# Patient Record
Sex: Male | Born: 2005 | Race: Black or African American | Hispanic: No | Marital: Single | Smoking: Never smoker
Health system: Southern US, Community
[De-identification: ages and names within clinical notes are randomized; demographics above are authoritative.]

## PROBLEM LIST (undated history)

## (undated) DIAGNOSIS — J45909 Unspecified asthma, uncomplicated: Secondary | ICD-10-CM

## (undated) HISTORY — PX: TONSILLECTOMY: SUR1361

---
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
## 2024-03-16 ENCOUNTER — Emergency Department (HOSPITAL_COMMUNITY)
Admission: EM | Admit: 2024-03-16 | Discharge: 2024-03-16 | Disposition: A | Discharge status: Home / Self Care | Attending: Emergency Medicine | Admitting: Emergency Medicine

## 2024-03-16 ENCOUNTER — Emergency Department (HOSPITAL_COMMUNITY)
Admission: EM | Admit: 2024-03-16 | Discharge: 2024-03-17 | Disposition: A | Discharge status: Home / Self Care | Source: Home / Self Care | Attending: Emergency Medicine | Admitting: Emergency Medicine

## 2024-03-16 ENCOUNTER — Other Ambulatory Visit: Payer: Self-pay

## 2024-03-16 ENCOUNTER — Encounter (HOSPITAL_COMMUNITY): Payer: Self-pay

## 2024-03-16 ENCOUNTER — Encounter (HOSPITAL_COMMUNITY): Payer: Self-pay | Admitting: Emergency Medicine

## 2024-03-16 DIAGNOSIS — Y92410 Unspecified street and highway as the place of occurrence of the external cause: Secondary | ICD-10-CM | POA: Diagnosis not present

## 2024-03-16 DIAGNOSIS — R369 Urethral discharge, unspecified: Secondary | ICD-10-CM | POA: Insufficient documentation

## 2024-03-16 DIAGNOSIS — M549 Dorsalgia, unspecified: Secondary | ICD-10-CM | POA: Diagnosis present

## 2024-03-16 DIAGNOSIS — S39012A Strain of muscle, fascia and tendon of lower back, initial encounter: Secondary | ICD-10-CM | POA: Diagnosis not present

## 2024-03-16 MED ORDER — LIDOCAINE 5 % EX PTCH
1.0000 | MEDICATED_PATCH | CUTANEOUS | Status: DC
  Administered 2024-03-16: 1 via TRANSDERMAL
  Filled 2024-03-16: qty 1

## 2024-03-16 MED ORDER — LIDOCAINE 5 % EX PTCH: 1.0000 | MEDICATED_PATCH | CUTANEOUS | 0 refills | Status: AC

## 2024-03-16 MED ORDER — CYCLOBENZAPRINE HCL 10 MG PO TABS: 10.0000 mg | ORAL_TABLET | Freq: Two times a day (BID) | ORAL | 0 refills | Status: AC | PRN

## 2024-03-16 MED ORDER — KETOROLAC TROMETHAMINE 15 MG/ML IJ SOLN
15.0000 mg | Freq: Once | INTRAMUSCULAR | Status: AC
  Administered 2024-03-16: 15 mg via INTRAMUSCULAR
  Filled 2024-03-16: qty 1
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
## 2024-03-16 NOTE — ED Triage Notes (Signed)
 Pt complaining of having a white discharge. Denies burning but "something doesn't feel right." Thinks it may chlamydia

## 2024-03-17 LAB — GC/CHLAMYDIA PROBE AMP (~~LOC~~) NOT AT ARMC
Chlamydia: POSITIVE — AB
Comment: NEGATIVE
Comment: NORMAL
Neisseria Gonorrhea: NEGATIVE

## 2024-03-17 MED ORDER — CEFTRIAXONE SODIUM 500 MG IJ SOLR
500.0000 mg | Freq: Once | INTRAMUSCULAR | Status: AC
  Administered 2024-03-17: 500 mg via INTRAMUSCULAR
  Filled 2024-03-17: qty 500

## 2024-03-17 MED ORDER — DOXYCYCLINE HYCLATE 100 MG PO CAPS: 100.0000 mg | ORAL_CAPSULE | Freq: Two times a day (BID) | ORAL | 0 refills | Status: AC

## 2024-03-17 NOTE — Discharge Instructions (Addendum)
 Take antibiotics as prescribed and complete the full course.  Follow up in your MyChart account for your test results. If your results are positive you will need to notify all partners for treatment. No sex for 10 days after all partners have been treated to prevent reinfection.   GO to the health department as needed for free testing.

## 2024-03-17 NOTE — ED Provider Notes (Signed)
 [place] EMERGENCY DEPARTMENT AT [facility] Provider Note   CSN: [id number] Arrival date & time: 03/16/24  2116     History  Chief Complaint  Patient presents with   possible std    Patrick Liu is a 18 y.o. male.  18 year old male presents emergency room with concern for white urethral discharge for the past 2 days, is sexually active.  Similar to prior chlamydia infection 3 years ago.  Denies sores or other complaints or concerns today.       Home Medications Prior to Admission medications   Medication Sig Start Date End Date Taking? Authorizing Provider  doxycycline (VIBRAMYCIN) 100 MG capsule Take 1 capsule (100 mg total) by mouth 2 (two) times daily for 7 days. 03/17/24 03/24/24 Yes Darlis Eisenmenger, PA-C  acetaminophen  (TYLENOL ) 160 MG/5ML liquid Take 17.9 mLs (572.8 mg total) by mouth every 6 (six) hours as needed for pain. 03/18/17   Jannine Meo, NP  cyclobenzaprine (FLEXERIL) 10 MG tablet Take 1 tablet (10 mg total) by mouth 2 (two) times daily as needed for muscle spasms. 03/16/24   Denese Finn, PA-C  famotidine  (PEPCID ) 20 MG tablet Take 1 tablet (20 mg total) by mouth daily for 7 days. 08/26/20 09/02/20  Kala Orchard, MD  guaiFENesin  (ROBITUSSIN) 100 MG/5ML liquid Take 5-10 mLs (100-200 mg total) by mouth every 4 (four) hours as needed for cough. 01/20/19   Landa Pine, FNP  ibuprofen  (CHILDRENS MOTRIN ) 100 MG/5ML suspension Take 20 mLs (400 mg total) by mouth every 6 (six) hours as needed for mild pain or moderate pain. 01/20/19   Jerri Morale A, FNP  lidocaine  (LIDODERM ) 5 % Place 1 patch onto the skin daily. Remove & Discard patch within 12 hours or as directed by MD 03/16/24   Denese Finn, PA-C      Allergies    Patient has no known allergies.    Review of Systems   Review of Systems Negative except as per HPI Physical Exam Updated Vital Signs BP (!) 124/92 (BP Location: Right Arm)   Pulse 66   Temp 98.1 F (36.7 C) (Oral)    Resp 18   Ht 5\' 10"  (1.778 m)   Wt 65.8 kg   SpO2 99%   BMI 20.81 kg/m  Physical Exam Vitals and nursing note reviewed. Exam conducted with a chaperone present.  Constitutional:      General: He is not in acute distress.    Appearance: He is well-developed. He is not diaphoretic.  HENT:     Head: Normocephalic and atraumatic.  Pulmonary:     Effort: Pulmonary effort is normal.  Genitourinary:    Penis: Normal and circumcised. No discharge.   Skin:    General: Skin is warm and dry.  Neurological:     Mental Status: He is alert and oriented to person, place, and time.  Psychiatric:        Behavior: Behavior normal.     ED Results / Procedures / Treatments   Labs (all labs ordered are listed, but only abnormal results are displayed) Labs Reviewed  GC/CHLAMYDIA PROBE AMP ([place]) NOT AT [facility]    EKG None  Radiology No results found.  Procedures Procedures    Medications Ordered in ED Medications  cefTRIAXone (ROCEPHIN) injection 500 mg (has no administration in time range)    ED Course/ Medical Decision Making/ A&P  Medical Decision Making  18 year old male with complaint of white urethral discharge x 2 days, sexually active, concern for chlamydia as feels similar to when he had this infection3 years ago.  Chaperone present for exam, no discharge present at this time, exam unremarkable.  Patient is provided with Rocephin and prescription for doxycycline.  Encouraged to take full course of antibiotics, abstain from intercourse, follow-up in his MyChart for his test results and notify partners if positive.        Final Clinical Impression(s) / ED Diagnoses Final diagnoses:  Urethral discharge in male    Rx / DC Orders ED Discharge Orders          Ordered    doxycycline (VIBRAMYCIN) 100 MG capsule  2 times daily        03/17/24 0040              Darlis Eisenmenger, PA-C 03/17/24 0050    Alissa April,  MD 03/17/24 [phone]

## 2024-11-23 ENCOUNTER — Encounter (HOSPITAL_COMMUNITY): Payer: Self-pay | Admitting: *Deleted

## 2024-11-23 ENCOUNTER — Ambulatory Visit (HOSPITAL_COMMUNITY)
Admission: EM | Admit: 2024-11-23 | Discharge: 2024-11-23 | Disposition: A | Discharge status: Home / Self Care | Attending: Emergency Medicine | Admitting: Emergency Medicine

## 2024-11-23 DIAGNOSIS — R369 Urethral discharge, unspecified: Secondary | ICD-10-CM | POA: Insufficient documentation

## 2024-11-23 DIAGNOSIS — R21 Rash and other nonspecific skin eruption: Secondary | ICD-10-CM | POA: Diagnosis not present

## 2024-11-23 LAB — HIV ANTIBODY (ROUTINE TESTING W REFLEX): HIV Screen 4th Generation wRfx: NONREACTIVE

## 2024-11-23 MED ORDER — MUPIROCIN 2 % EX OINT: 1.0000 | TOPICAL_OINTMENT | Freq: Two times a day (BID) | CUTANEOUS | 0 refills | Status: AC

## 2024-11-23 MED ORDER — CEFTRIAXONE SODIUM 500 MG IJ SOLR
500.0000 mg | INTRAMUSCULAR | Status: DC
  Administered 2024-11-23: 500 mg via INTRAMUSCULAR

## 2024-11-23 MED ORDER — LIDOCAINE HCL (PF) 1 % IJ SOLN
INTRAMUSCULAR | Status: AC
  Filled 2024-11-23: qty 2

## 2024-11-23 MED ORDER — CEFTRIAXONE SODIUM 500 MG IJ SOLR
INTRAMUSCULAR | Status: AC
  Filled 2024-11-23: qty 500

## 2024-11-23 MED ORDER — DOXYCYCLINE HYCLATE 100 MG PO TABS: 100.0000 mg | ORAL_TABLET | Freq: Two times a day (BID) | ORAL | 0 refills | Status: AC

## 2024-11-23 NOTE — ED Triage Notes (Signed)
 Pt states he has a rash on the back of his left leg X 3 days. He had been putting alcohol and peroxide on it.   Pt states he has discharge from his penis x 1 month. He states he was tested in jail but did not get results. He would like STI testing cyto and blood work

## 2024-11-23 NOTE — Discharge Instructions (Signed)
 We have given you an injection in clinic that we will treat gonorrhea.  Take the doxycycline  twice daily with food to help treat any potential chlamydia.  Use mupirocin  ointment twice daily to the bug bite site after cleaning with warm water and antibacterial solutions such as Dial soap or Hibiclens.  Abstain from intercourse until all results from STI screening of been received.  Notify any sexual partners that they can also receive treatment.  Return to clinic for new or urgent symptoms.

## 2024-11-23 NOTE — ED Provider Notes (Signed)
 " [facility]    CSN: [id number] Arrival date & time: 11/23/24  1500      History   Chief Complaint Chief Complaint  Patient presents with   Rash   SEXUALLY TRANSMITTED DISEASE    HPI Patrick Liu is a 19 y.o. male.   Presents to clinic with grandmother over concern of a potential bug bite to the left inner thigh as well as penile discharge   Was out playing basketball around 3 days ago and felt like something bit him on the left calf, was wearing shorts  Has been putting alcohol and peroxide on this w/o improvement  His girlfriend has been attempting to pop it, reports one of the areas did start draining earlier today  Concern over penile discharge for the past month, intermittent Penile discharge that is white / yellow  Sexually active, last intercourse was yesterday  Penile discharge for the past months  Denies sores lesions to penis  Sexually active without routine condom use   The history is provided by the patient, medical records and a parent.  Rash   Past Medical History:  Diagnosis Date   Asthma     There are no active problems to display for this patient.   Past Surgical History:  Procedure Laterality Date   TONSILLECTOMY         Home Medications    Prior to Admission medications  Medication Sig Start Date End Date Taking? Authorizing Provider  doxycycline  (VIBRA -TABS) 100 MG tablet Take 1 tablet (100 mg total) by mouth 2 (two) times daily for 7 days. 11/23/24 11/30/24 Yes Uchechi Denison  N, FNP  mupirocin  ointment (BACTROBAN ) 2 % Apply 1 Application topically 2 (two) times daily. 11/23/24  Yes Branden Vine  N, FNP  acetaminophen  (TYLENOL ) 160 MG/5ML liquid Take 17.9 mLs (572.8 mg total) by mouth every 6 (six) hours as needed for pain. 03/18/17   Everlean Laymon SAILOR, NP  cyclobenzaprine  (FLEXERIL ) 10 MG tablet Take 1 tablet (10 mg total) by mouth 2 (two) times daily as needed for muscle spasms. 03/16/24   Victor Lynwood DASEN, PA-C   famotidine  (PEPCID ) 20 MG tablet Take 1 tablet (20 mg total) by mouth daily for 7 days. 08/26/20 09/02/20  Henriette Mora, MD  guaiFENesin  (ROBITUSSIN) 100 MG/5ML liquid Take 5-10 mLs (100-200 mg total) by mouth every 4 (four) hours as needed for cough. 01/20/19   Adah Wilbert LABOR, FNP  ibuprofen  (CHILDRENS MOTRIN ) 100 MG/5ML suspension Take 20 mLs (400 mg total) by mouth every 6 (six) hours as needed for mild pain or moderate pain. 01/20/19   Adah Wilbert A, FNP  lidocaine  (LIDODERM ) 5 % Place 1 patch onto the skin daily. Remove & Discard patch within 12 hours or as directed by MD 03/16/24   Victor Lynwood DASEN, PA-C    Family History Family History  Problem Relation Age of Onset   Healthy Mother     Social History Social History[1]   Allergies   Patient has no known allergies.   Review of Systems Review of Systems  Per HPI  Physical Exam Triage Vital Signs ED Triage Vitals  Encounter Vitals Group     BP 11/23/24 1615 129/77     Girls Systolic BP Percentile --      Girls Diastolic BP Percentile --      Boys Systolic BP Percentile --      Boys Diastolic BP Percentile --      Pulse Rate 11/23/24 1615 79     Resp  11/23/24 1615 16     Temp 11/23/24 1615 98.4 F (36.9 C)     Temp Source 11/23/24 1615 Oral     SpO2 11/23/24 1615 98 %     Weight --      Height --      Head Circumference --      Peak Flow --      Pain Score 11/23/24 1614 0     Pain Loc --      Pain Education --      Exclude from Growth Chart --    No data found.  Updated Vital Signs BP 129/77 (BP Location: Left Arm)   Pulse 79   Temp 98.4 F (36.9 C) (Oral)   Resp 16   SpO2 98%   Visual Acuity Right Eye Distance:   Left Eye Distance:   Bilateral Distance:    Right Eye Near:   Left Eye Near:    Bilateral Near:     Physical Exam Vitals and nursing note reviewed.  Constitutional:      Appearance: Normal appearance.  HENT:     Head: Normocephalic and atraumatic.     Right Ear: External ear  normal.     Left Ear: External ear normal.     Nose: Nose normal.     Mouth/Throat:     Mouth: Mucous membranes are moist.  Eyes:     Conjunctiva/sclera: Conjunctivae normal.  Cardiovascular:     Rate and Rhythm: Normal rate.  Pulmonary:     Effort: Pulmonary effort is normal. No respiratory distress.  Skin:    General: Skin is warm and dry.      Neurological:     General: No focal deficit present.     Mental Status: He is alert.  Psychiatric:        Mood and Affect: Mood normal.      UC Treatments / Results  Labs (all labs ordered are listed, but only abnormal results are displayed) Labs Reviewed  SYPHILIS: RPR W/REFLEX TO RPR TITER AND TREPONEMAL ANTIBODIES, TRADITIONAL SCREENING AND DIAGNOSIS ALGORITHM  HIV ANTIBODY (ROUTINE TESTING W REFLEX)  CYTOLOGY, (ORAL, ANAL, URETHRAL) ANCILLARY ONLY    EKG   Radiology No results found.  Procedures Procedures (including critical care time)  Medications Ordered in UC Medications  cefTRIAXone  (ROCEPHIN ) injection 500 mg (has no administration in time range)    Initial Impression / Assessment and Plan / UC Course  I have reviewed the triage vital signs and the nursing notes.  Pertinent labs & imaging results that were available during my care of the patient were reviewed by me and considered in my medical decision making (see chart for details).  Vitals and triage reviewed, patient is hemodynamically stable.  Multi pocketed pustular lesion to the left inner calf.  Superficial.  Without streaking or surrounding warmth/erythema.  Will send in mupirocin  ointment encouraged proper wound care.  Doxycycline  has been sent in for chlamydia, this will cover against MRSA as well.  Cytology swab obtained due to complaint of penile discharge.  Will treat empirically for gonorrhea and chlamydia.  HIV and syphilis screening obtained as well.  Plan of care, follow-up care and return precautions given, no questions at this time.     Final Clinical Impressions(s) / UC Diagnoses   Final diagnoses:  Penile discharge  Rash and nonspecific skin eruption     Discharge Instructions      We have given you an injection in clinic that we will treat gonorrhea.  Take the doxycycline  twice daily with food to help treat any potential chlamydia.  Use mupirocin  ointment twice daily to the bug bite site after cleaning with warm water and antibacterial solutions such as Dial soap or Hibiclens.  Abstain from intercourse until all results from STI screening of been received.  Notify any sexual partners that they can also receive treatment.  Return to clinic for new or urgent symptoms.      ED Prescriptions     Medication Sig Dispense Auth. Provider   doxycycline  (VIBRA -TABS) 100 MG tablet Take 1 tablet (100 mg total) by mouth 2 (two) times daily for 7 days. 14 tablet Kambrie Eddleman  N, FNP   mupirocin  ointment (BACTROBAN ) 2 % Apply 1 Application topically 2 (two) times daily. 22 g Dreama, Taeden Geller  N, FNP      PDMP not reviewed this encounter.     [1]  Social History Tobacco Use   Smoking status: Every Day    Types: Cigarettes   Smokeless tobacco: Never  Vaping Use   Vaping status: Never Used  Substance Use Topics   Alcohol use: Yes   Drug use: Never     Dreama, Tasha Diaz  N, FNP 11/23/24 1700  "

## 2024-11-24 LAB — SYPHILIS: RPR W/REFLEX TO RPR TITER AND TREPONEMAL ANTIBODIES, TRADITIONAL SCREENING AND DIAGNOSIS ALGORITHM: RPR Ser Ql: NONREACTIVE

## 2024-11-25 ENCOUNTER — Ambulatory Visit (HOSPITAL_COMMUNITY): Payer: Self-pay

## 2024-11-25 LAB — CYTOLOGY, (ORAL, ANAL, URETHRAL) ANCILLARY ONLY
Chlamydia: NEGATIVE
Comment: NEGATIVE
Comment: NEGATIVE
Comment: NORMAL
Neisseria Gonorrhea: POSITIVE — AB
Trichomonas: POSITIVE — AB

## 2024-11-25 MED ORDER — METRONIDAZOLE 500 MG PO TABS: 2000.0000 mg | ORAL_TABLET | Freq: Once | ORAL | 0 refills | Status: AC
